# Patient Record
Sex: Male | Born: 1980 | Hispanic: Yes | Marital: Single | Smoking: Never smoker
Health system: Southern US, Community
[De-identification: ages and names within clinical notes are randomized; demographics above are authoritative.]

## PROBLEM LIST (undated history)

## (undated) HISTORY — PX: APPENDECTOMY: SHX54

---

## 2010-07-12 ENCOUNTER — Emergency Department (HOSPITAL_COMMUNITY): Payer: Medicaid Other

## 2010-07-12 ENCOUNTER — Other Ambulatory Visit (INDEPENDENT_AMBULATORY_CARE_PROVIDER_SITE_OTHER): Payer: Self-pay | Admitting: General Surgery

## 2010-07-12 ENCOUNTER — Observation Stay (HOSPITAL_COMMUNITY)
Admission: EM | Admit: 2010-07-12 | Discharge: 2010-07-13 | Disposition: A | Discharge status: Home / Self Care | Payer: Medicaid Other | Attending: General Surgery | Admitting: General Surgery

## 2010-07-12 DIAGNOSIS — K3580 Unspecified acute appendicitis: Principal | ICD-10-CM | POA: Insufficient documentation

## 2010-07-12 LAB — COMPREHENSIVE METABOLIC PANEL
ALT: 43 U/L (ref 0–53)
AST: 27 U/L (ref 0–37)
Albumin: 4.3 g/dL (ref 3.5–5.2)
CO2: 29 mEq/L (ref 19–32)
Calcium: 9.2 mg/dL (ref 8.4–10.5)
Chloride: 100 mEq/L (ref 96–112)
Creatinine, Ser: 0.59 mg/dL (ref 0.4–1.5)
GFR calc Af Amer: >60 mL/min (ref >60)
GFR calc non Af Amer: >60 mL/min (ref >60)
Sodium: 138 mEq/L (ref 135–145)
Total Bilirubin: 0.5 mg/dL (ref 0.3–1.2)

## 2010-07-12 LAB — CBC
HCT: 45.8 % (ref 39.0–52.0)
Hemoglobin: 16.3 g/dL (ref 13.0–17.0)
MCH: 31.8 pg (ref 26.0–34.0)
MCV: 89.3 fL (ref 78.0–100.0)
RBC: 5.13 MIL/uL (ref 4.22–5.81)
WBC: 11.5 10*3/uL — ABNORMAL HIGH (ref 4.0–10.5)

## 2010-07-12 LAB — URINALYSIS, ROUTINE W REFLEX MICROSCOPIC
Bilirubin Urine: NEGATIVE
Glucose, UA: NEGATIVE mg/dL
Ketones, ur: NEGATIVE mg/dL
Specific Gravity, Urine: 1.02 (ref 1.005–1.030)
pH: 6 (ref 5.0–8.0)

## 2010-07-12 LAB — URINE MICROSCOPIC-ADD ON

## 2010-07-12 MED ORDER — IOHEXOL 300 MG/ML  SOLN
100.0000 mL | Freq: Once | INTRAMUSCULAR | Status: AC | PRN
  Administered 2010-07-12: 100 mL via INTRAVENOUS

## 2010-07-31 ENCOUNTER — Emergency Department (HOSPITAL_COMMUNITY): Payer: Self-pay

## 2010-07-31 ENCOUNTER — Emergency Department (HOSPITAL_COMMUNITY)
Admission: EM | Admit: 2010-07-31 | Discharge: 2010-07-31 | Disposition: A | Discharge status: Home / Self Care | Payer: Self-pay | Attending: Emergency Medicine | Admitting: Emergency Medicine

## 2010-07-31 DIAGNOSIS — M546 Pain in thoracic spine: Secondary | ICD-10-CM | POA: Insufficient documentation

## 2010-07-31 DIAGNOSIS — IMO0002 Reserved for concepts with insufficient information to code with codable children: Secondary | ICD-10-CM | POA: Insufficient documentation

## 2010-07-31 DIAGNOSIS — S239XXA Sprain of unspecified parts of thorax, initial encounter: Secondary | ICD-10-CM | POA: Insufficient documentation

## 2010-07-31 LAB — URINALYSIS, ROUTINE W REFLEX MICROSCOPIC
Bilirubin Urine: NEGATIVE
Glucose, UA: NEGATIVE mg/dL
Specific Gravity, Urine: 1.021 (ref 1.005–1.030)
Urobilinogen, UA: 0.2 mg/dL (ref 0.0–1.0)

## 2010-07-31 LAB — URINE MICROSCOPIC-ADD ON

## 2010-08-02 NOTE — H&P (Signed)
  NAMEEDWING, FIGLEY   ACCOUNT NO.:  [id number]  MEDICAL RECORD NO.:  [id number]           PATIENT TYPE:  O  LOCATION:  5120                         FACILITY:  [facility]  PHYSICIAN:  Lennie Muckle, MD      DATE OF BIRTH:  17-Mar-1980  DATE OF ADMISSION:  07/12/2010 DATE OF DISCHARGE:                             HISTORY & PHYSICAL   CHIEF COMPLAINT:  Abdominal pain.  HISTORY OF PRESENT ILLNESS:  This is a 30 year old male who states he began having abdominal pain yesterday.  It is located in the right flank vicinity.  Pain started last night.  He had 3 beers.  He did have some mild nausea but no vomiting.  He denies any fevers, no diarrhea.  The pain persisted.  Describes as a achy pain persisted in the right side. He does not have any increasing pain with eating.  He has had no previous history of surgeries.  He was seen in the emergency department and noted to have an elevated white count of 11.5.  A CT was obtained which did reveal acute inflammation of the appendix with stranding and a large appendix consistent with acute appendicitis.  PAST MEDICAL HISTORY:  Negative.  PAST SURGICAL HISTORY:  Negative.  FAMILY HISTORY:  Negative.  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  No tobacco or alcohol use.  REVIEW OF SYSTEMS:  Negative other than the HPI.  MEDICATIONS:  None.  ALLERGIES:  None.  PHYSICAL EXAMINATION:  GENERAL:  Obese male in no acute distress, lying in stretcher. VITAL SIGNS:  Temperature is 98.7, heart rate 49, and blood pressure 135/76. HEENT:  Head is normocephalic, atraumatic.  Extraocular muscles are intact.  Pupils are equal and round.  Sclerae and conjunctivae are clear.  Nares are clear without drainage.  Oral mucosa is pink and moist.  Dentition is good. NECK:  No tenderness.  Normal range of motion.  Thyroid without abnormalities.  Trachea midline. RESPIRATORY:  Clear to auscultation  bilaterally.  Normal expansion and excursion. CARDIOVASCULAR:  Regular rate and rhythm.  No murmurs, gallops, or rubs. EXTREMITIES:  Pulses are palpable in upper extremity.  No edema in lower extremity. ABDOMEN:  Obese, soft, mildly tender on the right side of the abdomen. No frank peritonitis.  No hernias.  No masses.  Exam is a slight limited due to body habitus. MUSCULOSKELETAL:  No deformities, no pain with palpation of joints. NEUROLOGIC:  Normal. PSYCHOLOGIC:  Normal.  IMAGING:  CT is reviewed.  There is enlarged inflamed appendix.  No evidence of perforation.  Other labs are seemingly within normal limits with a mildly elevated white count of 11.5.  ASSESSMENT AND PLAN:  Acute appendicitis.  PLAN:  Laparoscopic appendectomy with hopefully discharge home after the surgery.  We will give Invanz preoperatively impending on the results of the CIB surgery and may send him home on antibiotics.     Lennie Muckle, MD     ALA/MEDQ  D:  07/12/2010  T:  07/12/2010  Job:  [id number]  Electronically Signed by Bertram Savin MD on 08/02/2010 12:15:49 PM

## 2010-08-02 NOTE — Op Note (Signed)
Gary Osborne, Gary Osborne   ACCOUNT NO.:  [id number]  MEDICAL RECORD NO.:  [id number]           PATIENT TYPE:  O  LOCATION:  5120                         FACILITY:  [facility]  PHYSICIAN:  Lennie Muckle, MD      DATE OF BIRTH:  Jul 14, 1980  DATE OF PROCEDURE: DATE OF DISCHARGE:                              OPERATIVE REPORT   PREOPERATIVE DIAGNOSIS:  Acute appendicitis.  POSTOPERATIVE DIAGNOSES: 1. Acute appendicitis. 2. Inflamed epiploica.  PROCEDURE:  Laparoscopic appendectomy with removal of epiploica.  SURGEON:  Lennie Muckle, MD  ASSISTANT:  OR staff.  FINDINGS: 1. Mildly inflamed appendix. 2. Inflamed epiploica with slight necrosis.  SPECIMEN: 1. Epiploica. 2. Appendix.  ESTIMATED BLOOD LOSS:  Minimal amount of blood loss.  ANESTHESIA:  General endotracheal.  INDICATIONS FOR PROCEDURE:  Gary Osborne is a 30 year old male with right flank pain for 24 hours, had a CT scan, which showed inflamed appendix and an inflammation in the right lower quadrant.  He was seen, evaluated, and agreed with diagnosis of acute appendicitis. Discussed laparoscopic appendectomy and received IV Invanz.  DETAILS OF PROCEDURE:  The patient was taken from the emergency department to preoperative holding, seen by Anesthesia and taken to the operating suite, and was given IV antibiotics prior to operating room. Once he was in the operating room, placed in supine position. Sequential compression devices were applied to his lower extremity.  He was placed under general endotracheal anesthesia and then a Foley catheter was placed.  His abdomen was prepped and draped in usual sterile fashion.  Surgical time-out was performed.  I began by placing an incision in the right upper quadrant after surgical time-out was performed.  I used a 5-mm camera in the OptiVu and placed the camera into the abdominal cavity.  All layers of abdominal wall were visualized upon entry.  I inspected  the abdomen and found no evidence of injury upon placement of trocar.  I then placed a 5-cm trocar at the umbilical region.  I then upsized the right upper quadrant with an 11-mm trocar and placed a 5-mm trocar in the left lower quadrant.  I began by mobilizing the cecum medially.  I dissected along the line of Toldt with the Harmonic scalpel.  Once I fully mobilized the cecum midportion of ascending colon, I continued point this medially.  I found a piece of necrotic tissue which I first thought that was the appendix.  This just pulled from away from around the vicinity of the cecum.  I placed an EndoCatch bag, had fatty tissue around it.  I further inspected around the cecum and found a specimen which appeared likely appendix.  I continued freeing this up and sure enough it was a mildly inflamed appendix coursing inferiorly.  I freed up the remaining lateral attachments with the Harmonic scalpel, continued dissecting the mesoappendix with a Harmonic scalpel.  Once I lengthened the appendix, I was able to place lateral laparoscopic load across the base of the appendix with a GI stapler.  I then placed the appendix in an EndoCatch bag and irrigated the abdomen.  I inspected the staple line and found no evidence of bleeding.  There was no evidence  of purulent fluid.  I used approximately a liter and a half of fluid to irrigate the abdomen.  Once I was satisfied there was no bleeding from the staple line of the mesoappendix, I removed the two specimens from the right upper quadrant. Once the specimens were removed, I closed the fascial defect using a 0 Vicryl suture on the suture passer.  Once satisfied with my closure, I injected 30 mL of Marcaine at all sites for local block.  The patient tolerated the procedure well.  I closed the skin with a 4-0 Monocryl and the Dermabond was placed for final dressing.  The patient was extubated and transferred to the postanesthesia care in stable  condition, to be discharged home either today or tomorrow.     Lennie Muckle, MD     ALA/MEDQ  D:  07/12/2010  T:  07/13/2010  Job:  [id number]  Electronically Signed by Bertram Savin MD on 08/02/2010 12:15:53 PM

## 2010-08-11 ENCOUNTER — Ambulatory Visit (INDEPENDENT_AMBULATORY_CARE_PROVIDER_SITE_OTHER): Payer: Self-pay | Admitting: Surgery

## 2010-08-11 ENCOUNTER — Encounter (INDEPENDENT_AMBULATORY_CARE_PROVIDER_SITE_OTHER): Payer: Self-pay | Admitting: Surgery

## 2010-08-11 DIAGNOSIS — K3580 Unspecified acute appendicitis: Secondary | ICD-10-CM | POA: Insufficient documentation

## 2010-08-11 MED ORDER — NAPROXEN 500 MG PO TABS: 500.0000 mg | ORAL_TABLET | Freq: Two times a day (BID) | ORAL | Status: AC

## 2010-08-11 NOTE — Progress Notes (Signed)
Subjective:     Patient ID: Gary Osborne, male   DOB: 1980/12/23, 30 y.o.   MRN: [id number]    There were no vitals taken for this visit.    HPI  Diagnosis acute appendicitis  Reason visits followup.  Patient comes a feeling sore in his abdomen. Its mainly in his right upper quadrant @ the larger port site where the appendix was extracted. He went to the emergency room with some right back pain. He was started on some anti-inflammatories. The naproxen helped. He wishes a refill on some pain meds.  Patient denies fevers chills or sweats. Patient denies nausea vomiting. He's eating well. He is having regular bowel movements. No drainage from his incisions. He does feel lumps of the incisions and that was a concern to him.  Review of Systems  Constitutional: Negative for fever, chills and diaphoresis.  Gastrointestinal: Positive for abdominal pain. Negative for vomiting, diarrhea, constipation, abdominal distention, anal bleeding and rectal pain.  Genitourinary: Negative for dysuria, scrotal swelling and testicular pain.  Musculoskeletal: Positive for myalgias. Negative for back pain, joint swelling, arthralgias and gait problem.  All other systems reviewed and are negative.       Objective:   Physical Exam  Constitutional: He is oriented to person, place, and time. He appears well-developed and well-nourished. No distress.  HENT:  Head: Normocephalic.  Mouth/Throat: Oropharynx is clear and moist. No oropharyngeal exudate.  Cardiovascular: Normal rate and intact distal pulses.   Pulmonary/Chest: Effort normal. No respiratory distress.  Abdominal: Soft. Bowel sounds are normal. He exhibits no distension. There is no rebound and no guarding.       Mild TTP at RUQ port site only.  No cellulitis, no hernia.  Normal healing ridges at incisions.  Musculoskeletal: Normal range of motion. He exhibits no edema and no tenderness.  Neurological: He is alert and oriented to person,  place, and time. No cranial nerve deficit.  Skin: Skin is warm and dry. No rash noted. He is not diaphoretic. No erythema. No pallor.  Psychiatric: He has a normal mood and affect. His behavior is normal.       Assessment:     Acute appendicitis with no evidence of complications  Right flank and upper quadrant of soreness consistent with musculoskeletal strain. Improving.    Plan:     Return to clinic p.r.n.  The naproxen and heat/ice for muscular strain. I suspect is related to increased activity. He started at work. I see no hernia, cellulitis, or other concerns. He felt reassured. I noted normal healing ridges will soften and flatten down in the next one to 2 months.  Discussion was made with R. Spanish interpreter. He felt with this plan.

## 2011-09-04 IMAGING — CT CT ABD-PELV W/ CM
2 of 4 series · 14 of 32 positions shown, 19 images · IV contrast (agent unspecified)
Comparison: None.

CLINICAL DATA: Right lower quadrant abdominal pain and right flank
pain with nausea.

CT ABDOMEN AND PELVIS WITH CONTRAST
TECHNIQUE: Multidetector CT imaging of the abdomen and pelvis was
performed following the standard protocol during bolus
administration of intravenous contrast.
Contrast: 100 ml Qmnipaque-WZZ IV

[Series 2: routine abdomen · axial · 0.85mm/px · z∈[-421,-16]mm · 8 of 105 slices shown, 13 images]
[im 12/105  soft-tissue]
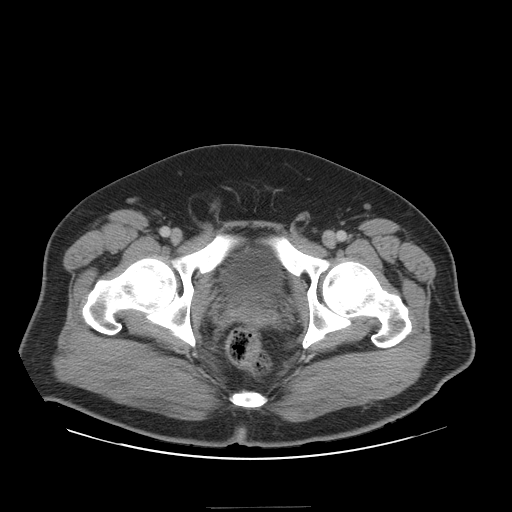
[im 12/105  bone]
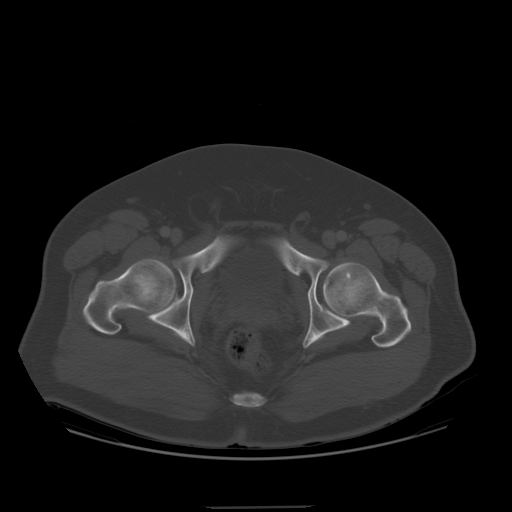
[im 24/105  soft-tissue]
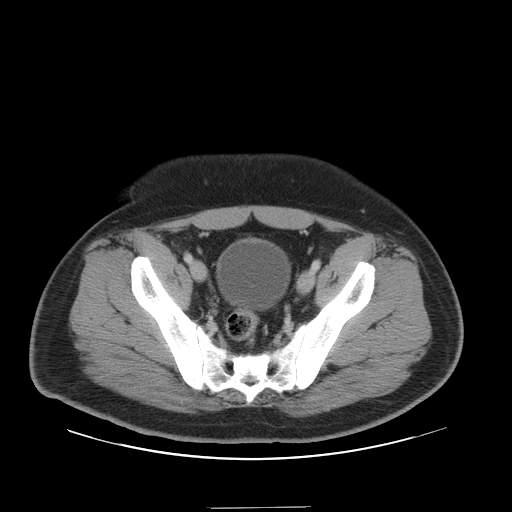
[im 35/105  soft-tissue]
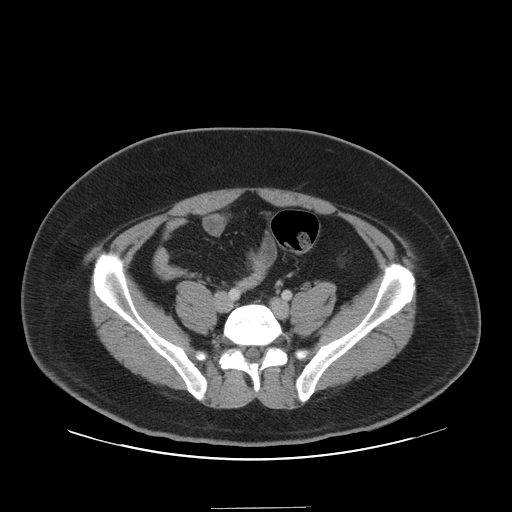
[im 47/105  soft-tissue]
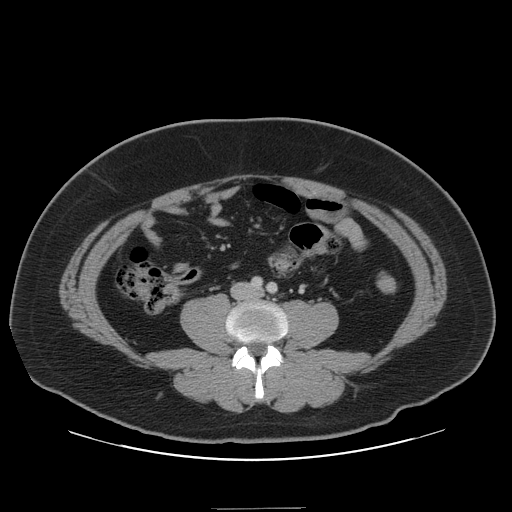
[im 58/105  soft-tissue]
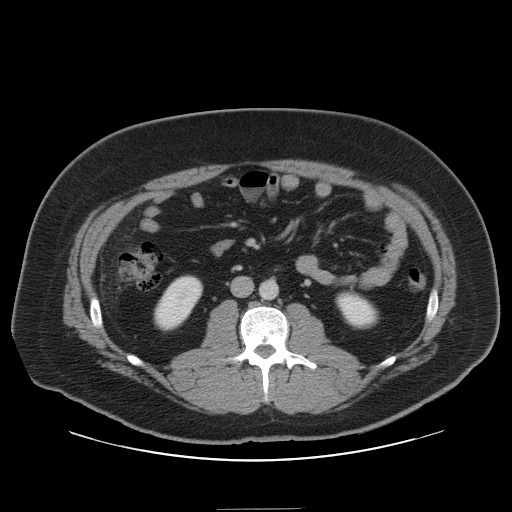
[im 58/105  lung]
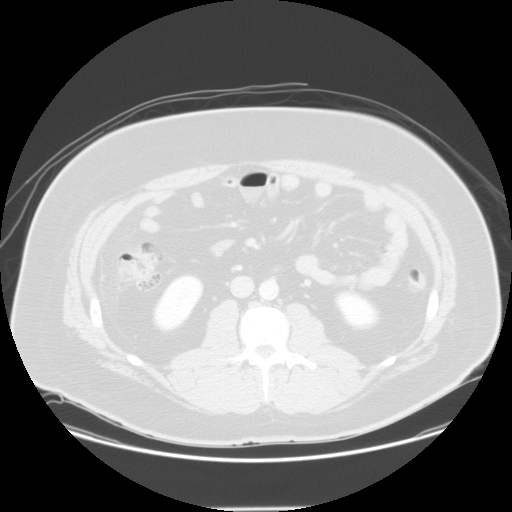
[im 70/105  soft-tissue]
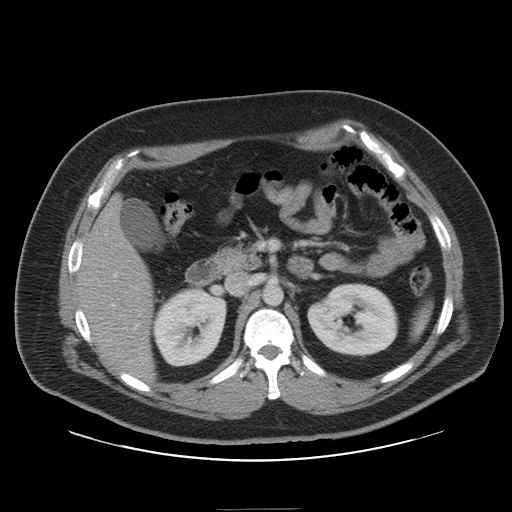
[im 70/105  lung]
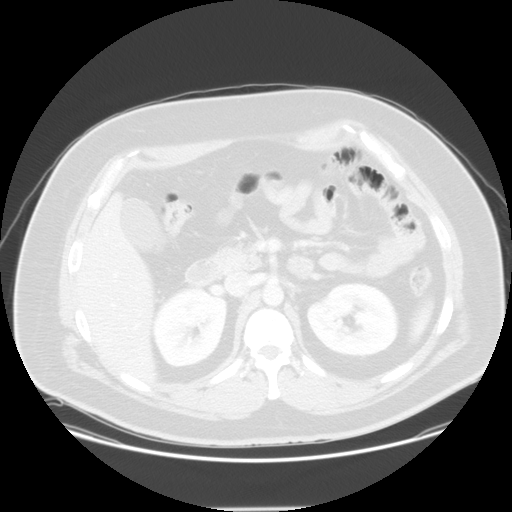
[im 81/105  soft-tissue]
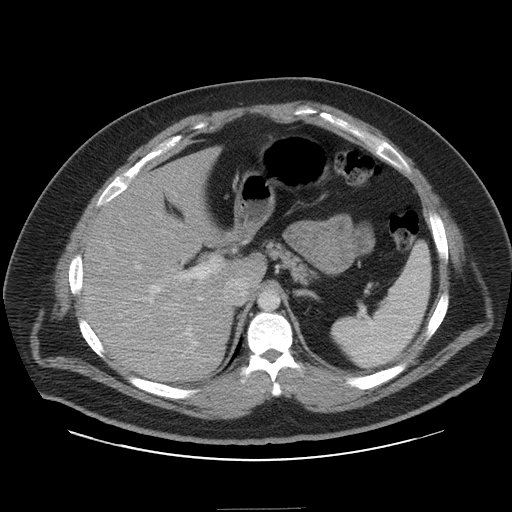
[im 81/105  lung]
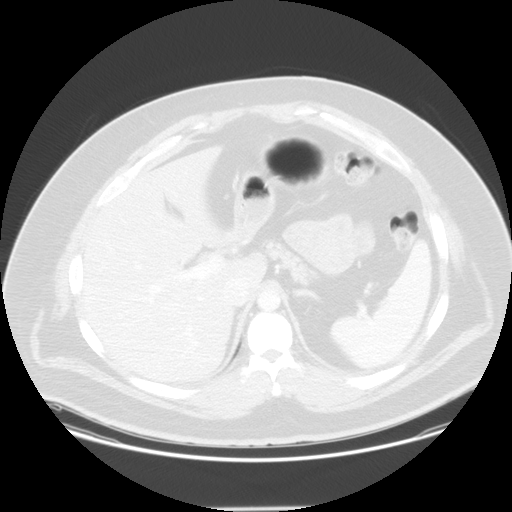
[im 93/105  soft-tissue]
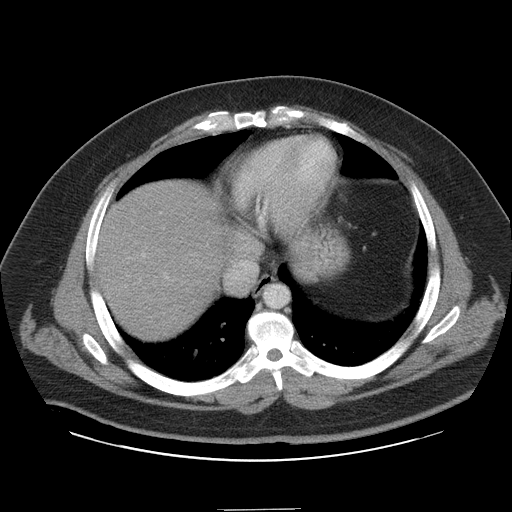
[im 93/105  lung]
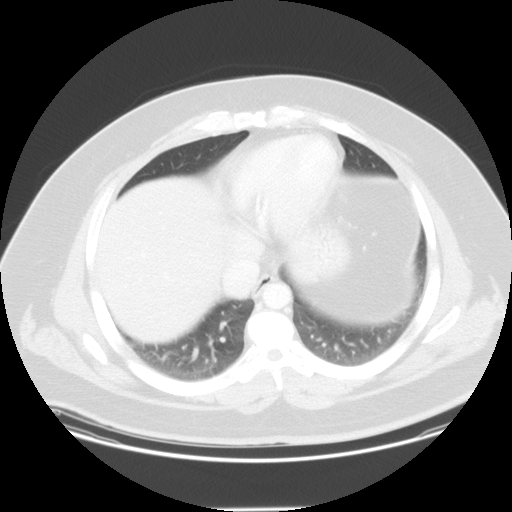

[Series 401: sagittal · sagittal · 1.04mm/px · 6 of 119 slices shown]
[im 12/119  soft-tissue]
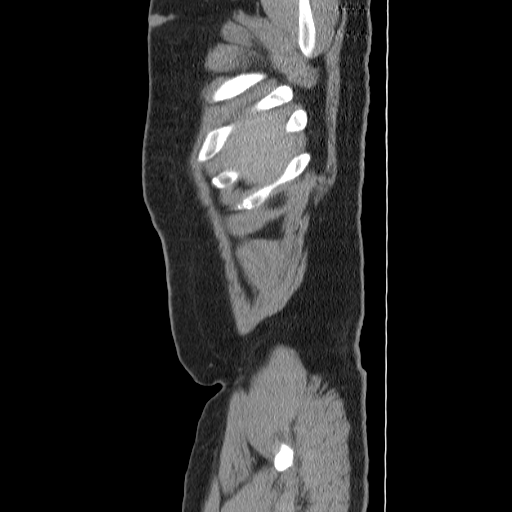
[im 24/119  soft-tissue]
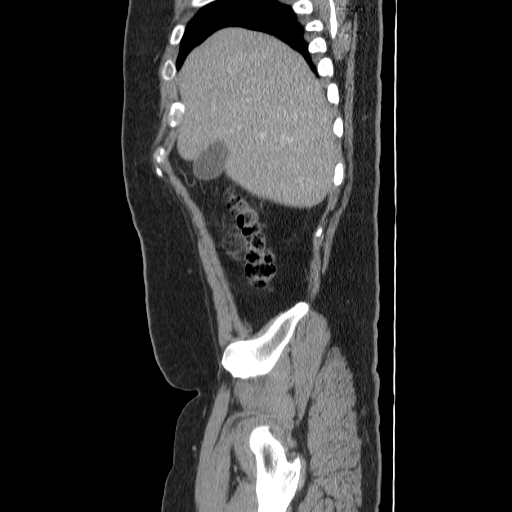
[im 36/119  soft-tissue]
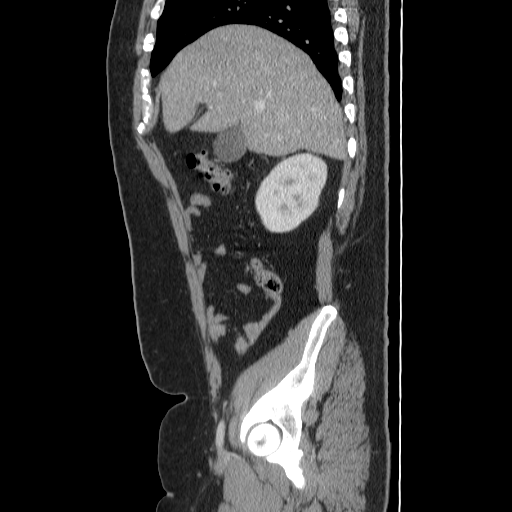
[im 48/119  soft-tissue]
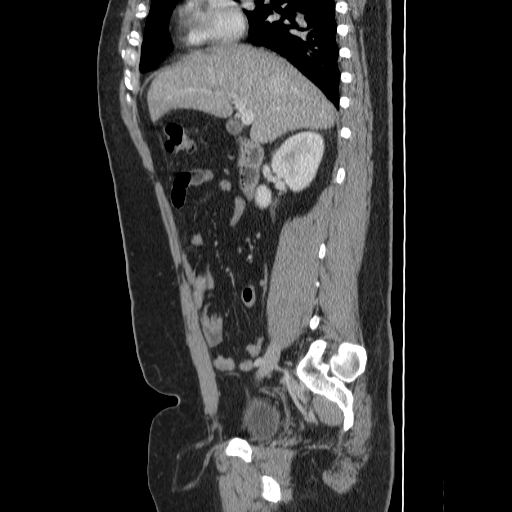
[im 71/119  soft-tissue]
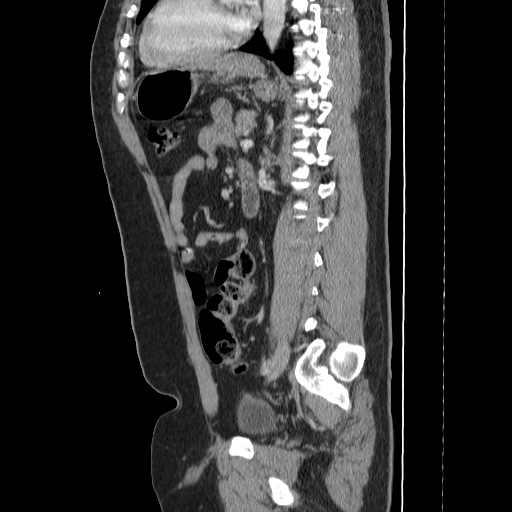
[im 83/119  soft-tissue]
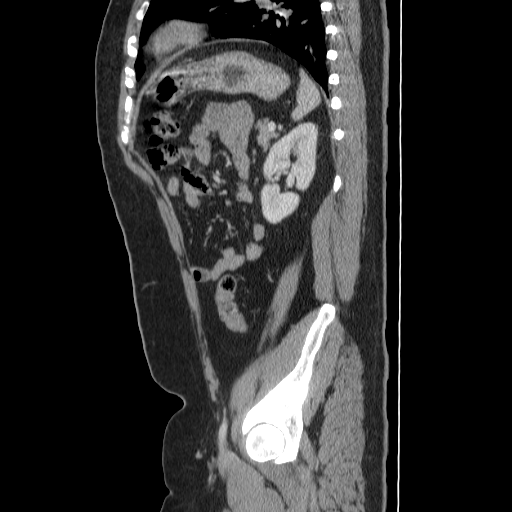

[14 of 32 positions shown; findings below may reference images not displayed]

FINDINGS: There is evidence of acute appendicitis with inflamed
appendix present which lies just lateral to the cecum.  Margins of
the appendix are somewhat obscured by surrounding inflammatory
changes.  The appendix is dilated to at least 10 mm.  Significant
surrounding inflammatory changes may be consistent with
perforation.  However, there is no evidence of abscess or free air.

No other acute findings.  The liver shows mild fatty infiltration.
The gallbladder, pancreas, adrenal glands, kidneys and spleen are
unremarkable.  There is a small right inguinal hernia containing
fat.  The bladder is unremarkable.  No incidental masses or
enlarged lymph nodes.  Bony structures are unremarkable.
IMPRESSION: Acute appendicitis with inflamed appendix lying lateral to the
cecum.  Significant inflammatory changes surround the appendix.  No
evidence of focal abscess or free air.

## 2011-09-23 IMAGING — CR DG CHEST 2V
2 series · 2 of 2 positions shown · non-contrast
Comparison: None.

CLINICAL DATA: Right-sided pleuritic chest pain.

CHEST - 2 VIEW

[w chest pa]
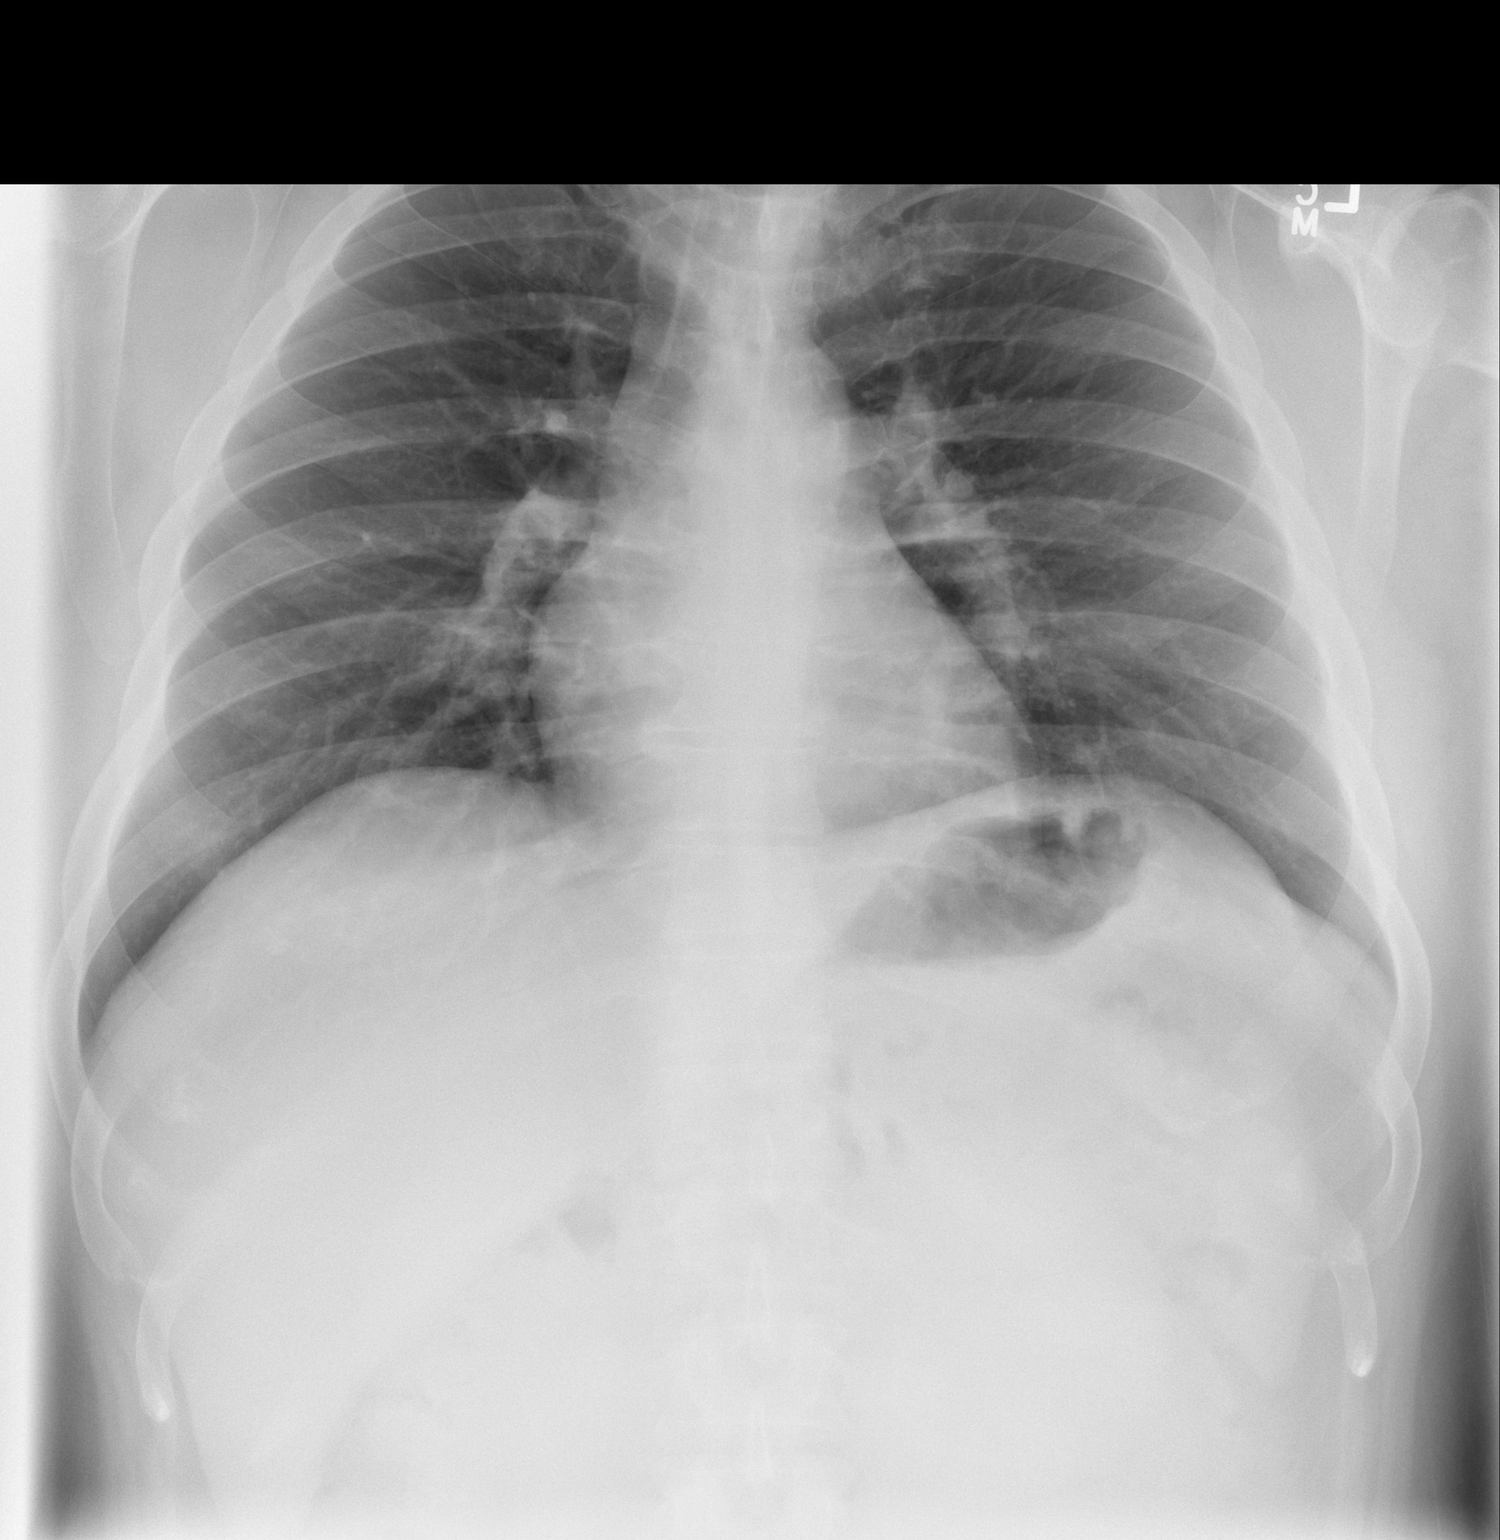

[w chest lat]
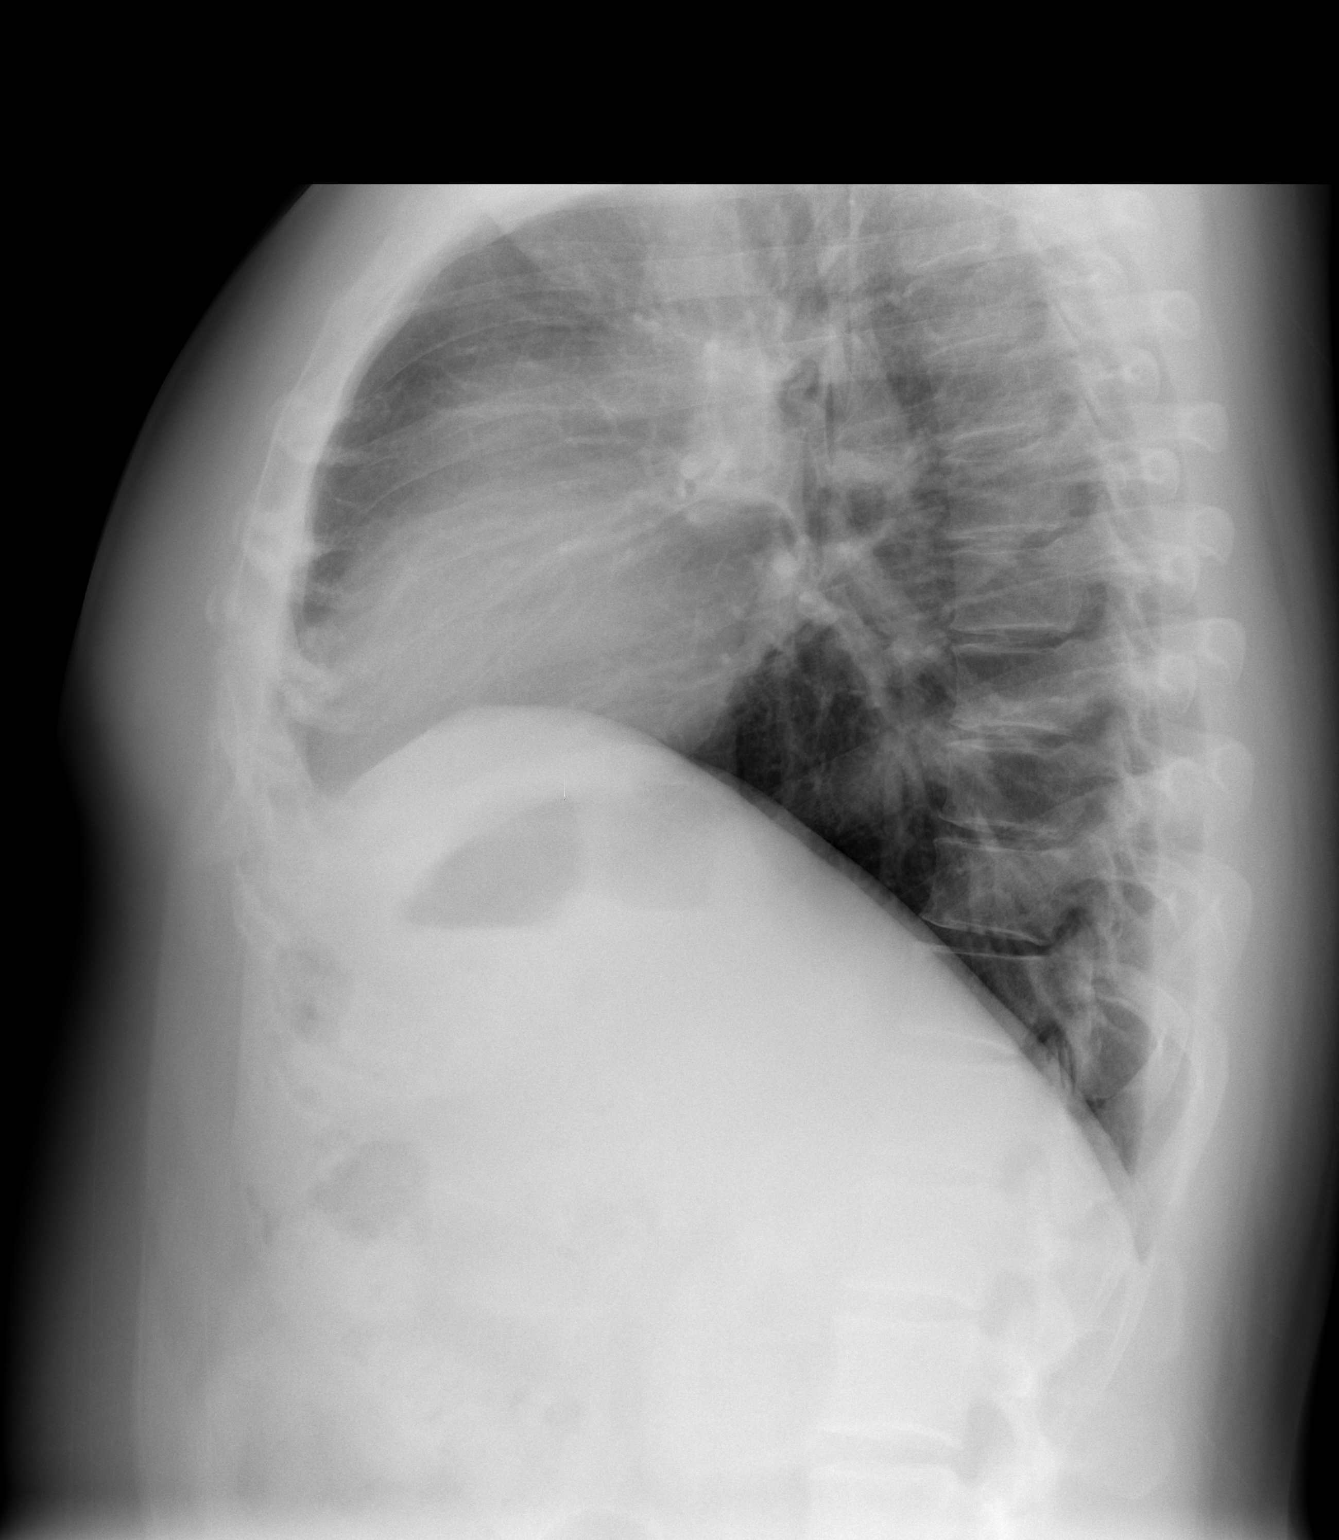

[2 of 2 positions shown; findings below may reference images not displayed]

FINDINGS: The heart size and mediastinal contours are within
normal limits.  Both lungs are clear.  The visualized skeletal
structures are unremarkable.
IMPRESSION: No active cardiopulmonary disease.

## 2015-08-12 ENCOUNTER — Emergency Department (HOSPITAL_COMMUNITY)
Admission: EM | Admit: 2015-08-12 | Discharge: 2015-08-12 | Disposition: A | Discharge status: Home / Self Care | Payer: Medicaid Other | Attending: Emergency Medicine | Admitting: Emergency Medicine

## 2015-08-12 DIAGNOSIS — Z791 Long term (current) use of non-steroidal anti-inflammatories (NSAID): Secondary | ICD-10-CM | POA: Insufficient documentation

## 2015-08-12 DIAGNOSIS — K649 Unspecified hemorrhoids: Secondary | ICD-10-CM

## 2015-08-12 NOTE — ED Notes (Signed)
Pt with right ear pain and itching for the last several months. States has been using prescribed drops for several refills and no improvement in symptoms. Pt also c/o a sore near his rectum for the last couple months. Pt reports burning pain at the site when wiping. Pt reports some constipation as well. Pt awake, alert, oriented x4, VSS.

## 2015-08-12 NOTE — ED Provider Notes (Signed)
CSN: [id number]     Arrival date & time 08/12/15  0810 History   First MD Initiated Contact with Patient 08/12/15 0813     Chief Complaint  Patient presents with  . Otalgia  . Rectal Pain   HPI The patient presents to emergency room with complaints of itching in his right ear. Patient states this started several months ago. He has seen his primary doctor and was given prescription for eardrops. The symptoms persist. He denies any drainage. He denies any difficulty with his hearing. It's not painful. It seems to come and go. He often notices a scratching or movement type sensation inside his right ear in the mornings.  Patient's primary doctor told him that if it continued the next step would be to see an ENT doctor.  Patient also noticed a small lump in his rectal area of the other day. He is concerned it could be something like cancer. He denies any trouble with pain, fevers.  He has had some constipation recently. No past medical history on file. Past Surgical History  Procedure Laterality Date  . Appendectomy     No family history on file. Social History  Substance Use Topics  . Smoking status: Never Smoker   . Smokeless tobacco: Never Used  . Alcohol Use: Yes    Review of Systems  All other systems reviewed and are negative.     Allergies  Review of patient's allergies indicates no known allergies.  Home Medications   Prior to Admission medications   Medication Sig Start Date End Date Taking? Authorizing Provider  naproxen (NAPROSYN) 500 MG tablet Take 1 tablet (500 mg total) by mouth 2 (two) times daily with a meal. 08/11/10   Karie SodaSteven Gross, MD   BP 155/85 mmHg  Pulse 106  Temp(Src) 97.9 F (36.6 C) (Oral)  Resp 14  SpO2 97% Physical Exam  Constitutional: He appears well-developed and well-nourished. No distress.  HENT:  Head: Normocephalic and atraumatic.  Right Ear: Tympanic membrane, external ear and ear canal normal. No drainage or swelling. No foreign bodies.  No middle ear effusion.  Left Ear: External ear normal.  Eyes: Conjunctivae are normal. Right eye exhibits no discharge. Left eye exhibits no discharge. No scleral icterus.  Neck: Neck supple. No tracheal deviation present.  Cardiovascular: Normal rate.   Pulmonary/Chest: Effort normal. No stridor. No respiratory distress.  Genitourinary: Rectal exam shows external hemorrhoid.  Small external hemorrhoid that is not erythematous or indurated  Musculoskeletal: He exhibits no edema.  Neurological: He is alert. Cranial nerve deficit: no gross deficits.  Skin: Skin is warm and dry. No rash noted.  Psychiatric: He has a normal mood and affect.  Nursing note and vitals reviewed.   ED Course  Procedures (including critical care time)   MDM   Final diagnoses:  Hemorrhoids, unspecified hemorrhoid type    I reassured the patient that he has a hemorrhoid. This is a benign condition. It could be related to his recent constipation. He can do warm sitz baths and use over-the-counter stool softeners as needed.  There is no foreign body or any other abnormality noted in his ear. I recommended he follow up with his doctor and discuss whether an ENT consultation would be helpful.    Linwood DibblesJon Winona Sison, MD 08/12/15 [phone]

## 2016-02-22 ENCOUNTER — Ambulatory Visit (INDEPENDENT_AMBULATORY_CARE_PROVIDER_SITE_OTHER): Payer: Self-pay | Admitting: Orthopedic Surgery

## 2016-02-22 ENCOUNTER — Encounter (INDEPENDENT_AMBULATORY_CARE_PROVIDER_SITE_OTHER): Payer: Self-pay

## 2016-02-22 DIAGNOSIS — M654 Radial styloid tenosynovitis [de Quervain]: Secondary | ICD-10-CM | POA: Insufficient documentation

## 2016-02-22 MED ORDER — LIDOCAINE HCL 1 % IJ SOLN
1.0000 mL | INTRAMUSCULAR | Status: AC | PRN
  Administered 2016-02-22: 1 mL

## 2016-02-22 MED ORDER — METHYLPREDNISOLONE ACETATE 40 MG/ML IJ SUSP
40.0000 mg | INTRAMUSCULAR | Status: AC | PRN
  Administered 2016-02-22: 40 mg

## 2016-02-22 NOTE — Progress Notes (Signed)
   Office Visit Note   Patient: Gary GavelJaime Gutierrez-Mondragon           Date of Birth: [birth year]           MRN: [id number] Visit Date: 02/22/2016              Requested by: No referring provider defined for this encounter. PCP: No PCP Per Patient  Chief Complaint  Patient presents with  . Left Wrist - Pain    HPI: Patient is a 36 year old gentleman who reports 3 months of constant left wrist pain. At the base of the thumb. Has tried splinting as well as naproxen and other nonsteroidals. States these provided relief while taking them as soon as he stops the course has return of pain and symptoms. 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  Right-hand-dominant    Assessment & Plan: Visit Diagnoses:  1. De Quervain's tenosynovitis, left     Plan: the first dorsal extensor compartment was injected he tolerated this well he will uses splint as needed and follow up as needed. Discussed that he may require an additional injection.  Follow-Up Instructions: Return if symptoms worsen or fail to improve.   Ortho Exam Examination patient is alert oriented no adenopathy well-dressed normal affect normal respiratory effort he has a normal gait. Examination patient is pain to palpation of the first dorsal extensor compartment. The Finkelstein's test is positive. The remainder of his exam is normal he has good strength and good sensation.  Imaging: No results found.  Orders:  No orders of the defined types were placed in this encounter.  No orders of the defined types were placed in this encounter.    Procedures: Hand/UE Inj Date/Time: 02/22/2016 4:01 PM Performed by: Tammera Engert V Authorized by: Nadara MustardUDA, Kelbie Moro V   Consent Given by:  Patient Site marked: the procedure site was marked   Timeout: prior to procedure the correct patient, procedure, and site was verified   Indications:  Diagnostic and therapeutic Condition: de Quervain's     Site:  L extensor compartment 1 Prep: patient was prepped and draped in usual sterile fashion   Needle Size:  22 G Approach:  Dorsal Ultrasound Guidance: No   Medications:  1 mL lidocaine 1 %; 40 mg methylPREDNISolone acetate 40 MG/ML Patient tolerance:  Patient tolerated the procedure well with no immediate complications    Clinical Data: No additional findings.  Subjective: Review of Systems  Objective: Vital Signs: There were no vitals taken for this visit.  Specialty Comments:  No specialty comments available.  PMFS History: Patient Active Problem List   Diagnosis Date Noted  . De Quervain's tenosynovitis, left 02/22/2016  . Acute appendicitis, uncomplicated 08/11/2010   No past medical history on file.  No family history on file.  Past Surgical History:  Procedure Laterality Date  . APPENDECTOMY     Social History   Occupational History  . Not on file.   Social History Main Topics  . Smoking status: Never Smoker  . Smokeless tobacco: Never Used  . Alcohol use Yes  . Drug use: Unknown  . Sexual activity: Not on file

## 2016-03-16 ENCOUNTER — Telehealth (INDEPENDENT_AMBULATORY_CARE_PROVIDER_SITE_OTHER): Payer: Self-pay | Admitting: *Deleted

## 2016-03-16 NOTE — Telephone Encounter (Signed)
Patient came in this morning in regards to having some continued pain in his left arm. He had an injection on January the 8th and he is still having some pain and hurts to grab things. His CB # [phone]. Thank you

## 2016-03-16 NOTE — Telephone Encounter (Signed)
Per last note Dr. Lajoyce Cornersuda said to come back if not better. Pt needs appt. Please call

## 2016-03-21 ENCOUNTER — Ambulatory Visit (INDEPENDENT_AMBULATORY_CARE_PROVIDER_SITE_OTHER): Payer: Self-pay | Admitting: Orthopedic Surgery

## 2016-03-21 DIAGNOSIS — M654 Radial styloid tenosynovitis [de Quervain]: Secondary | ICD-10-CM

## 2016-03-21 MED ORDER — LIDOCAINE HCL 1 % IJ SOLN
1.0000 mL | INTRAMUSCULAR | Status: AC | PRN
  Administered 2016-03-21: 1 mL

## 2016-03-21 MED ORDER — METHYLPREDNISOLONE ACETATE 40 MG/ML IJ SUSP
40.0000 mg | INTRAMUSCULAR | Status: AC | PRN
  Administered 2016-03-21: 40 mg

## 2016-03-21 NOTE — Progress Notes (Signed)
   Office Visit Note   Patient: Gary GavelJaime Osborne           Date of Birth: 1980-08-10           MRN: [id number] Visit Date: 03/21/2016              Requested by: No referring provider defined for this encounter. PCP: No PCP Per Patient  Chief Complaint  Patient presents with  . Left Hand - Follow-up    De Quervain's tenosynovitis, left       HPI: Patient here today for follow up of left hand De Quervain's tenosynovitis s/p injection last month. The pt states that while this did help greatly that he is still having decreased grip/ strength. He also say when direct pressure is applied to the wrist he has pain and feels a "clicking" in the wrist Rodena MedinAutumn L Forrest, RMA     Assessment & Plan: Visit Diagnoses:  1. De Quervain's tenosynovitis, left     Plan: Increase activities as tolerated follow-up as needed possible repeat injection follow-up.  Follow-Up Instructions: Return if symptoms worsen or fail to improve.   Ortho Exam On examination patient is alert oriented no adenopathy well-dressed normal affect. Examination he has good pulses he has point tender to palpation over the first dorsal extensor compartment left wrist there is some mild crepitation with range of motion. Finkelstein's test is painful. After informed consent the first dorsal extensor compartment was injected without complications.  Imaging: No results found.  Orders:  No orders of the defined types were placed in this encounter.  No orders of the defined types were placed in this encounter.    Procedures: Hand/UE Inj Date/Time: 03/21/2016 10:50 AM Performed by: Hason Ofarrell V Authorized by: Nadara MustardUDA, Hanley Woerner V   Consent Given by:  Patient Site marked: the procedure site was marked   Timeout: prior to procedure the correct patient, procedure, and site was verified   Indications:  Diagnostic and therapeutic Condition: de Quervain's   Site:  L extensor compartment 1 Prep: patient was prepped and  draped in usual sterile fashion   Needle Size:  22 G Approach:  Volar Ultrasound Guidance: No   Medications:  1 mL lidocaine 1 %; 40 mg methylPREDNISolone acetate 40 MG/ML Patient tolerance:  Patient tolerated the procedure well with no immediate complications    Clinical Data: No additional findings.  Subjective: Review of Systems  Objective: Vital Signs: There were no vitals taken for this visit.  Specialty Comments:  No specialty comments available.  PMFS History: Patient Active Problem List   Diagnosis Date Noted  . De Quervain's tenosynovitis, left 02/22/2016  . Acute appendicitis, uncomplicated 08/11/2010   No past medical history on file.  No family history on file.  Past Surgical History:  Procedure Laterality Date  . APPENDECTOMY     Social History   Occupational History  . Not on file.   Social History Main Topics  . Smoking status: Never Smoker  . Smokeless tobacco: Never Used  . Alcohol use Yes  . Drug use: Unknown  . Sexual activity: Not on file

## 2017-02-03 ENCOUNTER — Encounter (INDEPENDENT_AMBULATORY_CARE_PROVIDER_SITE_OTHER): Payer: Self-pay | Admitting: Family

## 2017-02-03 ENCOUNTER — Ambulatory Visit (INDEPENDENT_AMBULATORY_CARE_PROVIDER_SITE_OTHER): Payer: Self-pay | Admitting: Family

## 2017-02-03 DIAGNOSIS — G5601 Carpal tunnel syndrome, right upper limb: Secondary | ICD-10-CM

## 2017-02-03 MED ORDER — PREDNISONE 50 MG PO TABS: ORAL_TABLET | ORAL | 0 refills | Status: DC

## 2017-02-13 ENCOUNTER — Encounter (INDEPENDENT_AMBULATORY_CARE_PROVIDER_SITE_OTHER): Payer: Self-pay | Admitting: Family

## 2017-02-13 ENCOUNTER — Encounter (INDEPENDENT_AMBULATORY_CARE_PROVIDER_SITE_OTHER): Payer: Self-pay | Admitting: Orthopaedic Surgery

## 2017-02-13 ENCOUNTER — Ambulatory Visit (INDEPENDENT_AMBULATORY_CARE_PROVIDER_SITE_OTHER): Payer: Self-pay | Admitting: Orthopaedic Surgery

## 2017-02-13 DIAGNOSIS — G5601 Carpal tunnel syndrome, right upper limb: Secondary | ICD-10-CM

## 2017-02-13 NOTE — Progress Notes (Signed)
   Office Visit Note   Patient: Gary GavelJaime Gutierrez-Mondragon           Date of Birth: Mar 01, 1980           MRN: [id number] Visit Date: 02/13/2017              Requested by: No referring provider defined for this encounter. PCP: Patient, No Pcp Per   Assessment & Plan: Visit Diagnoses:  1. Carpal tunnel syndrome, right upper limb     Plan: Impression is right carpal tunnel syndrome.  EMG is ordered today to assess severity.  Follow-up after the EMGs.  Follow-Up Instructions: Return if symptoms worsen or fail to improve.   Orders:  No orders of the defined types were placed in this encounter.  No orders of the defined types were placed in this encounter.     Procedures: No procedures performed   Clinical Data: No additional findings.   Subjective: Chief Complaint  Patient presents with  . Right Wrist - Pain    Patient follows up today for his suspected right carpal tunnel syndrome.  He is partially better from a prednisone taper.  He still continues to have some numbness in his hand and fingers.    Review of Systems   Objective: Vital Signs: There were no vitals taken for this visit.  Physical Exam  Ortho Exam Right hand exam is stable.  No significant muscular atrophy. Specialty Comments:  No specialty comments available.  Imaging: No results found.   PMFS History: Patient Active Problem List   Diagnosis Date Noted  . De Quervain's tenosynovitis, left 02/22/2016  . Acute appendicitis, uncomplicated 08/11/2010   History reviewed. No pertinent past medical history.  History reviewed. No pertinent family history.  Past Surgical History:  Procedure Laterality Date  . APPENDECTOMY     Social History   Occupational History  . Not on file  Tobacco Use  . Smoking status: Never Smoker  . Smokeless tobacco: Never Used  Substance and Sexual Activity  . Alcohol use: Yes  . Drug use: Not on file  . Sexual activity: Not on file

## 2017-02-13 NOTE — Progress Notes (Signed)
   Office Visit Note   Patient: Gary Osborne           Date of Birth: [birth year]           MRN: [id number] Visit Date: 02/03/2017              Requested by: No referring provider defined for this encounter. PCP: Patient, No Pcp Per  Chief Complaint  Patient presents with  . Right Wrist - Pain      HPI: The patient is a 36 year old gentleman seen seen for evaluation of right wrist pain for last month or more. Is worse at night. 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 Has been having increased pain with work as well. Is right hand dominant. Swelling to hand and wrist. Taking ibu and naproxen without relief.   Has hx of de quervain on left.   Assessment & Plan: Visit Diagnoses:  1. Carpal tunnel syndrome, right upper limb     Plan: Will trial pred course. Continue wrist splint every pm and whenever able during day. Will follow with Xu to discuss carpal tunnel release.   Follow-Up Instructions: Return in about 2 weeks (around 02/17/2017), or if symptoms worsen or fail to improve.   Right Hand Exam   Tenderness  The patient is experiencing tenderness in the radial area.  Range of Motion  The patient has normal right wrist ROM.   Muscle Strength  The patient has normal right wrist strength. Grip: 5/5   Tests  Phalen's sign: positive Tinel's sign (median nerve): positive Finkelstein's test: negative  Other  Erythema: absent Pulse: present      Patient is alert, oriented, no adenopathy, well-dressed, normal affect, normal respiratory effort.   Imaging: No results found. No images are attached to the encounter.  Labs: No results found for: HGBA1C, ESRSEDRATE, CRP, LABURIC, REPTSTATUS, GRAMSTAIN, CULT, LABORGA  @LABSALLVALUES (HGBA1)@  There is no height or weight on file to calculate BMI.  Orders:  No orders of the defined types were placed in this encounter.  Meds ordered this encounter  Medications  . DISCONTD: predniSONE (DELTASONE) 50 MG tablet    Sig:  Take one tablet by mouth for 5 days.    Dispense:  5 tablet    Refill:  0  . predniSONE (DELTASONE) 50 MG tablet    Sig: Take one tablet by mouth for 5 days.    Dispense:  5 tablet    Refill:  0     Procedures: No procedures performed  Clinical Data: No additional findings.  ROS:  All other systems negative, except as noted in the HPI. Review of Systems  Objective: Vital Signs: There were no vitals taken for this visit.  Specialty Comments:  No specialty comments available.  PMFS History: Patient Active Problem List   Diagnosis Date Noted  . De Quervain's tenosynovitis, left 02/22/2016  . Acute appendicitis, uncomplicated 08/11/2010   History reviewed. No pertinent past medical history.  History reviewed. No pertinent family history.  Past Surgical History:  Procedure Laterality Date  . APPENDECTOMY     Social History   Occupational History  . Not on file  Tobacco Use  . Smoking status: Never Smoker  . Smokeless tobacco: Never Used  Substance and Sexual Activity  . Alcohol use: Yes  . Drug use: Not on file  . Sexual activity: Not on file

## 2017-02-13 NOTE — Addendum Note (Signed)
Addended by: Albertina ParrGARCIA, Emad Brechtel on: 02/13/2017 08:43 AM   Modules accepted: Orders

## 2017-03-03 ENCOUNTER — Telehealth (INDEPENDENT_AMBULATORY_CARE_PROVIDER_SITE_OTHER): Payer: Self-pay | Admitting: Orthopaedic Surgery

## 2017-03-03 ENCOUNTER — Other Ambulatory Visit (INDEPENDENT_AMBULATORY_CARE_PROVIDER_SITE_OTHER): Payer: Self-pay

## 2017-03-03 MED ORDER — PREDNISONE 50 MG PO TABS: ORAL_TABLET | ORAL | 0 refills | Status: AC

## 2017-03-03 NOTE — Telephone Encounter (Signed)
Rx sent into pharm. [facility] on [address]

## 2017-03-03 NOTE — Telephone Encounter (Signed)
Patient walked in this morning asking if he could get a RX for the pain in his hand. He said the prednisone worked really well for him. He just wants something to hold him over til his appointment next Wednesday with Dr. Alvester MorinNewton for the nerve conduction study. States he cannot get any sleep at night just from the pain and would just like a good nights sleep. CB# [phone]

## 2017-03-03 NOTE — Telephone Encounter (Signed)
Let's do another prednisone taper then.

## 2017-03-08 ENCOUNTER — Encounter (INDEPENDENT_AMBULATORY_CARE_PROVIDER_SITE_OTHER): Payer: Self-pay | Admitting: Physical Medicine and Rehabilitation

## 2017-03-08 ENCOUNTER — Ambulatory Visit (INDEPENDENT_AMBULATORY_CARE_PROVIDER_SITE_OTHER): Payer: Self-pay | Admitting: Physical Medicine and Rehabilitation

## 2017-03-08 DIAGNOSIS — R202 Paresthesia of skin: Secondary | ICD-10-CM

## 2017-03-08 NOTE — Progress Notes (Deleted)
Right arm pain- worse at night. Tightness and pain in first, second, and fourth ringers with some numbness. Says he can handle it during the day while at work, but the symptoms seem to worsen when he gets home and his "hands relax more." Right hand dominant.

## 2017-03-09 ENCOUNTER — Encounter (INDEPENDENT_AMBULATORY_CARE_PROVIDER_SITE_OTHER): Payer: Self-pay | Admitting: Physical Medicine and Rehabilitation

## 2017-03-09 NOTE — Procedures (Signed)
EMG & NCV Findings: Evaluation of the right median motor nerve showed prolonged distal onset latency (5.6 ms) and decreased conduction velocity (Elbow-Wrist, 47 m/s).  The right median (across palm) sensory nerve showed no response (Palm) and prolonged distal peak latency (5.0 ms).  All remaining nerves (as indicated in the following tables) were within normal limits.    All examined muscles (as indicated in the following table) showed no evidence of electrical instability.     Impression: The above electrodiagnostic study is ABNORMAL and reveals evidence of a moderate median nerve entrapment at the wrist (carpal tunnel syndrome) affecting sensory and motor components.   There is no significant electrodiagnostic evidence of any other focal nerve entrapment, brachial plexopathy or generalized peripheral neuropathy.   Recommendations: 1.  Follow-up with referring physician. 2.  Continue current management of symptoms. 3.  Continue use of resting splint at night-time and as needed during the day. 4.  Suggest surgical evaluation.   Nerve Conduction Studies Anti Sensory Summary Table   Stim Site NR Peak (ms) Norm Peak (ms) P-T Amp (V) Norm P-T Amp Site1 Site2 Delta-P (ms) Dist (cm) Vel (m/s) Norm Vel (m/s)  Right Median Acr Palm Anti Sensory (2nd Digit)  32.5C  Wrist    *5.0 <3.6 20.0 >10 Wrist Palm  0.0    Palm *NR  <2.0          Right Radial Anti Sensory (Base 1st Digit)  32.8C  Wrist    2.2 <3.1 25.2  Wrist Base 1st Digit 2.2 0.0    Right Ulnar Anti Sensory (5th Digit)  33.1C  Wrist    3.0 <3.7 28.5 >15.0 Wrist 5th Digit 3.0 14.0 47 >38   Motor Summary Table   Stim Site NR Onset (ms) Norm Onset (ms) O-P Amp (mV) Norm O-P Amp Site1 Site2 Delta-0 (ms) Dist (cm) Vel (m/s) Norm Vel (m/s)  Right Median Motor (Abd Poll Brev)  32.5C  Wrist    *5.6 <4.2 8.6 >5 Elbow Wrist 4.7 22.0 *47 >50  Elbow    10.3  9.3         Right Ulnar Motor (Abd Dig Min)  32.5C  Wrist    2.7 <4.2 8.7 >3 B  Elbow Wrist 3.6 23.0 64 >53  B Elbow    6.3  8.1  A Elbow B Elbow 1.2 10.0 83 >53  A Elbow    7.5  8.1          EMG   Side Muscle Nerve Root Ins Act Fibs Psw Amp Dur Poly Recrt Int Dennie Bible Comment  Right Abd Poll Brev Median C8-T1 Nml Nml Nml Nml Nml 0 Nml Nml   Right 1stDorInt Ulnar C8-T1 Nml Nml Nml Nml Nml 0 Nml Nml   Right PronatorTeres Median C6-7 Nml Nml Nml Nml Nml 0 Nml Nml     Nerve Conduction Studies Anti Sensory Left/Right Comparison   Stim Site L Lat (ms) R Lat (ms) L-R Lat (ms) L Amp (V) R Amp (V) L-R Amp (%) Site1 Site2 L Vel (m/s) R Vel (m/s) L-R Vel (m/s)  Median Acr Palm Anti Sensory (2nd Digit)  32.5C  Wrist  *5.0   20.0  Wrist Palm     Palm             Radial Anti Sensory (Base 1st Digit)  32.8C  Wrist  2.2   25.2  Wrist Base 1st Digit     Ulnar Anti Sensory (5th Digit)  33.1C  Wrist  3.0  28.5  Wrist 5th Digit  47    Motor Left/Right Comparison   Stim Site L Lat (ms) R Lat (ms) L-R Lat (ms) L Amp (mV) R Amp (mV) L-R Amp (%) Site1 Site2 L Vel (m/s) R Vel (m/s) L-R Vel (m/s)  Median Motor (Abd Poll Brev)  32.5C  Wrist  *5.6   8.6  Elbow Wrist  *47   Elbow  10.3   9.3        Ulnar Motor (Abd Dig Min)  32.5C  Wrist  2.7   8.7  B Elbow Wrist  64   B Elbow  6.3   8.1  A Elbow B Elbow  83   A Elbow  7.5   8.1           Waveforms:

## 2017-03-09 NOTE — Progress Notes (Signed)
Gary Osborne - 37 y.o. male MRN [id number]  Date of birth: 1980-03-13  Office Visit Note: Visit Date: 03/08/2017 PCP: Patient, No Pcp Per Referred by: No ref. provider found  Subjective: Chief Complaint  Patient presents with  . Right Arm - Pain  . Right Hand - Numbness, Pain   HPI: Gary Osborne is a 37 year old right-hand-dominant gentleman who comes in today at the request of Dr. Roda Shutters for electrodiagnostic study of the right upper limb.  He reports worsening right arm pain particularly at night with tightness first second fourth years with numbness.  He reports that during the day at work he gets some symptoms but he is okay but at night it really becomes prominent.  He has had no prior electrodiagnostic studies.  He denies any frank radicular pain.  He said no specific trauma.  Failed conservative care with medication management he was initially seen by Dr. Lajoyce Corners was diagnosed with de Quervain's tenosynovitis.  He went on to follow up with Dr. Roda Shutters.  He reports consistent worsening.    ROS Otherwise per HPI.  Assessment & Plan: Visit Diagnoses:  1. Paresthesia of skin     Plan: No additional findings.  Impression: The above electrodiagnostic study is ABNORMAL and reveals evidence of a moderate median nerve entrapment at the wrist (carpal tunnel syndrome) affecting sensory and motor components.   There is no significant electrodiagnostic evidence of any other focal nerve entrapment, brachial plexopathy or generalized peripheral neuropathy.   Recommendations: 1.  Follow-up with referring physician. 2.  Continue current management of symptoms. 3.  Continue use of resting splint at night-time and as needed during the day. 4.  Suggest surgical evaluation.  Meds & Orders: No orders of the defined types were placed in this encounter.   Orders Placed This Encounter  Procedures  . NCV with EMG (electromyography)    Follow-up: Return if symptoms worsen or fail  to improve.   Procedures: No procedures performed  EMG & NCV Findings: Evaluation of the right median motor nerve showed prolonged distal onset latency (5.6 ms) and decreased conduction velocity (Elbow-Wrist, 47 m/s).  The right median (across palm) sensory nerve showed no response (Palm) and prolonged distal peak latency (5.0 ms).  All remaining nerves (as indicated in the following tables) were within normal limits.    All examined muscles (as indicated in the following table) showed no evidence of electrical instability.     Impression: The above electrodiagnostic study is ABNORMAL and reveals evidence of a moderate median nerve entrapment at the wrist (carpal tunnel syndrome) affecting sensory and motor components.   There is no significant electrodiagnostic evidence of any other focal nerve entrapment, brachial plexopathy or generalized peripheral neuropathy.   Recommendations: 1.  Follow-up with referring physician. 2.  Continue current management of symptoms. 3.  Continue use of resting splint at night-time and as needed during the day. 4.  Suggest surgical evaluation.   Nerve Conduction Studies Anti Sensory Summary Table   Stim Site NR Peak (ms) Norm Peak (ms) P-T Amp (V) Norm P-T Amp Site1 Site2 Delta-P (ms) Dist (cm) Vel (m/s) Norm Vel (m/s)  Right Median Acr Palm Anti Sensory (2nd Digit)  32.5C  Wrist    *5.0 <3.6 20.0 >10 Wrist Palm  0.0    Palm *NR  <2.0          Right Radial Anti Sensory (Base 1st Digit)  32.8C  Wrist    2.2 <3.1 25.2  Wrist Base 1st Digit 2.2  0.0    Right Ulnar Anti Sensory (5th Digit)  33.1C  Wrist    3.0 <3.7 28.5 >15.0 Wrist 5th Digit 3.0 14.0 47 >38   Motor Summary Table   Stim Site NR Onset (ms) Norm Onset (ms) O-P Amp (mV) Norm O-P Amp Site1 Site2 Delta-0 (ms) Dist (cm) Vel (m/s) Norm Vel (m/s)  Right Median Motor (Abd Poll Brev)  32.5C  Wrist    *5.6 <4.2 8.6 >5 Elbow Wrist 4.7 22.0 *47 >50  Elbow    10.3  9.3         Right Ulnar  Motor (Abd Dig Min)  32.5C  Wrist    2.7 <4.2 8.7 >3 B Elbow Wrist 3.6 23.0 64 >53  B Elbow    6.3  8.1  A Elbow B Elbow 1.2 10.0 83 >53  A Elbow    7.5  8.1          EMG   Side Muscle Nerve Root Ins Act Fibs Psw Amp Dur Poly Recrt Int Dennie BiblePat Comment  Right Abd Poll Brev Median C8-T1 Nml Nml Nml Nml Nml 0 Nml Nml   Right 1stDorInt Ulnar C8-T1 Nml Nml Nml Nml Nml 0 Nml Nml   Right PronatorTeres Median C6-7 Nml Nml Nml Nml Nml 0 Nml Nml     Nerve Conduction Studies Anti Sensory Left/Right Comparison   Stim Site L Lat (ms) R Lat (ms) L-R Lat (ms) L Amp (V) R Amp (V) L-R Amp (%) Site1 Site2 L Vel (m/s) R Vel (m/s) L-R Vel (m/s)  Median Acr Palm Anti Sensory (2nd Digit)  32.5C  Wrist  *5.0   20.0  Wrist Palm     Palm             Radial Anti Sensory (Base 1st Digit)  32.8C  Wrist  2.2   25.2  Wrist Base 1st Digit     Ulnar Anti Sensory (5th Digit)  33.1C  Wrist  3.0   28.5  Wrist 5th Digit  47    Motor Left/Right Comparison   Stim Site L Lat (ms) R Lat (ms) L-R Lat (ms) L Amp (mV) R Amp (mV) L-R Amp (%) Site1 Site2 L Vel (m/s) R Vel (m/s) L-R Vel (m/s)  Median Motor (Abd Poll Brev)  32.5C  Wrist  *5.6   8.6  Elbow Wrist  *47   Elbow  10.3   9.3        Ulnar Motor (Abd Dig Min)  32.5C  Wrist  2.7   8.7  B Elbow Wrist  64   B Elbow  6.3   8.1  A Elbow B Elbow  83   A Elbow  7.5   8.1           Waveforms:            Clinical History: No specialty comments available.  He reports that  has never smoked. he has never used smokeless tobacco. No results for input(s): HGBA1C, LABURIC in the last 8760 hours.  Objective:  VS:  HT:    WT:   BMI:     BP:   HR: bpm  TEMP: ( )  RESP:  Physical Exam  Musculoskeletal:  Inspection reveals no atrophy of the bilateral APB or FDI or hand intrinsics. There is no swelling, color changes, allodynia or dystrophic changes. There is 5 out of 5 strength in the bilateral wrist extension, finger abduction and long finger flexion. There is  intact sensation to  light touch in all dermatomal and peripheral nerve distributions. There is a negative Froment's test bilaterally. There is a negative Tinel's test at the bilateral wrist and elbow. There is a negative Phalen's test bilaterally. There is a negative Hoffmann's test bilaterally.    Ortho Exam Imaging: No results found.  Past Medical/Family/Surgical/Social History: Medications & Allergies reviewed per EMR Patient Active Problem List   Diagnosis Date Noted  . De Quervain's tenosynovitis, left 02/22/2016  . Acute appendicitis, uncomplicated 08/11/2010   History reviewed. No pertinent past medical history. History reviewed. No pertinent family history. Past Surgical History:  Procedure Laterality Date  . APPENDECTOMY     Social History   Occupational History  . Not on file  Tobacco Use  . Smoking status: Never Smoker  . Smokeless tobacco: Never Used  Substance and Sexual Activity  . Alcohol use: Yes  . Drug use: Not on file  . Sexual activity: Not on file

## 2017-03-13 ENCOUNTER — Encounter (INDEPENDENT_AMBULATORY_CARE_PROVIDER_SITE_OTHER): Payer: Self-pay | Admitting: Orthopaedic Surgery

## 2017-03-13 ENCOUNTER — Ambulatory Visit (INDEPENDENT_AMBULATORY_CARE_PROVIDER_SITE_OTHER): Payer: Self-pay | Admitting: Orthopaedic Surgery

## 2017-03-13 DIAGNOSIS — G5601 Carpal tunnel syndrome, right upper limb: Secondary | ICD-10-CM

## 2017-03-13 NOTE — Progress Notes (Signed)
   Office Visit Note   Patient: Gary GavelJaime Osborne           Date of Birth: [birth year]           MRN: [id number] Visit Date: 03/13/2017              Requested by: No referring provider defined for this encounter. PCP: Patient, No Pcp Per   Assessment & Plan: Visit Diagnoses:  1. Carpal tunnel syndrome, right upper limb     Plan: Impression is 37 year old gentleman with carpal tunnel syndrome.  We discussed surgical versus nonsurgical options and the associated risks benefits alternatives.  He understands and wishes to proceed with surgical release.  We will get him scheduled in the near future.  Follow-Up Instructions: Return if symptoms worsen or fail to improve.   Orders:  No orders of the defined types were placed in this encounter.  No orders of the defined types were placed in this encounter.     Procedures: No procedures performed   Clinical Data: No additional findings.   Subjective: Chief Complaint  Patient presents with  . Follow-up    review EMG/NCV    Patient is a 37 year old gentleman who comes in today for review of his nerve conduction studies.    Review of Systems   Objective: Vital Signs: There were no vitals taken for this visit.  Physical Exam  Ortho Exam Exam is stable.  Impression is moderate right carpal tunnel syndrome.  We discussed treatment options both surgical versus nonsurgical and the associated risk benefits alternatives. Specialty Comments:  No specialty comments available.  Imaging: No results found.   PMFS History: Patient Active Problem List   Diagnosis Date Noted  . De Quervain's tenosynovitis, left 02/22/2016  . Acute appendicitis, uncomplicated 08/11/2010   History reviewed. No pertinent past medical history.  History reviewed. No pertinent family history.  Past Surgical History:  Procedure Laterality Date  . APPENDECTOMY     Social History   Occupational History  . Not on file  Tobacco Use  .  Smoking status: Never Smoker  . Smokeless tobacco: Never Used  Substance and Sexual Activity  . Alcohol use: Yes  . Drug use: Not on file  . Sexual activity: Not on file

## 2021-03-11 ENCOUNTER — Encounter: Payer: Self-pay | Admitting: Emergency Medicine

## 2021-03-11 ENCOUNTER — Ambulatory Visit
Admission: EM | Admit: 2021-03-11 | Discharge: 2021-03-11 | Disposition: A | Discharge status: Home / Self Care | Payer: Self-pay | Attending: Nurse Practitioner | Admitting: Nurse Practitioner

## 2021-03-11 ENCOUNTER — Other Ambulatory Visit: Payer: Self-pay

## 2021-03-11 DIAGNOSIS — J111 Influenza due to unidentified influenza virus with other respiratory manifestations: Secondary | ICD-10-CM

## 2021-03-11 DIAGNOSIS — R509 Fever, unspecified: Secondary | ICD-10-CM

## 2021-03-11 DIAGNOSIS — Z1152 Encounter for screening for COVID-19: Secondary | ICD-10-CM

## 2021-03-11 LAB — POCT INFLUENZA A/B
Influenza A, POC: NEGATIVE
Influenza B, POC: NEGATIVE

## 2021-03-11 MED ORDER — IBUPROFEN 800 MG PO TABS
800.0000 mg | ORAL_TABLET | Freq: Once | ORAL | Status: AC
  Administered 2021-03-11: 800 mg via ORAL

## 2021-03-11 NOTE — ED Provider Notes (Signed)
[facility]    CSN: [id number] Arrival date & time: 03/11/21  0806      History   Chief Complaint Chief Complaint  Patient presents with   Generalized Body Aches    HPI Gary Osborne is a 41 y.o. male.   Subjective:   Gary Osborne is a 41 y.o. male who presents for evaluation of influenza like symptoms. Symptoms include suspected fevers but not measured at home, chills, dry cough, headache, myalgia and runny nose.  Symptoms have been present for the past 2 days. He has tried to alleviate the symptoms with rest and a medication from [place] with minimal relief. High risk factors for influenza complications: none.  Denies any sick contacts or known COVID exposure.  He has never tested positive for COVID but thinks he had it a couple years ago.  He has been vaccinated against COVID with no boosters.  He is also had the flu vaccine this year.  The following portions of the patient's history were reviewed and updated as appropriate: allergies, current medications, past family history, past medical history, past social history, past surgical history, and problem list.      History reviewed. No pertinent past medical history.  Patient Active Problem List   Diagnosis Date Noted   De Quervain's tenosynovitis, left 02/22/2016   Acute appendicitis, uncomplicated 08/11/2010    Past Surgical History:  Procedure Laterality Date   APPENDECTOMY         Home Medications    Prior to Admission medications   Medication Sig Start Date End Date Taking? Authorizing Provider  naproxen (NAPROSYN) 500 MG tablet Take 1 tablet (500 mg total) by mouth 2 (two) times daily with a meal. 08/11/10   Karie Soda, MD  predniSONE (DELTASONE) 50 MG tablet Take one tablet by mouth for 5 days. 03/03/17   Tarry Kos, MD    Family History No family history on file.  Social History Social History   Tobacco Use   Smoking status: Never   Smokeless tobacco:  Never  Substance Use Topics   Alcohol use: Yes     Allergies   Patient has no known allergies.   Review of Systems Review of Systems  Constitutional:  Positive for chills, fatigue and fever.  HENT:  Positive for rhinorrhea. Negative for congestion, sneezing and sore throat.   Respiratory:  Positive for cough. Negative for shortness of breath.   Gastrointestinal:  Negative for diarrhea, nausea and vomiting.  Musculoskeletal:  Positive for myalgias.  Neurological:  Positive for headaches.  All other systems reviewed and are negative.   Physical Exam Triage Vital Signs ED Triage Vitals [03/11/21 0831]  Enc Vitals Group     BP (!) 138/98     Pulse Rate (!) 123     Resp 20     Temp (!) 102.8 F (39.3 C)     Temp Source Oral     SpO2 94 %     Weight      Height      Head Circumference      Peak Flow      Pain Score 4     Pain Loc      Pain Edu?      Excl. in GC?    No data found.  Updated Vital Signs BP (!) 138/98 (BP Location: Left Arm)    Pulse (!) 123    Temp (!) 102.8 F (39.3 C) (Oral)    Resp 20    SpO2  94%   Visual Acuity Right Eye Distance:   Left Eye Distance:   Bilateral Distance:    Right Eye Near:   Left Eye Near:    Bilateral Near:     Physical Exam Vitals reviewed.  Constitutional:      General: He is not in acute distress.    Appearance: Normal appearance. He is not ill-appearing, toxic-appearing or diaphoretic.  HENT:     Head: Normocephalic.     Nose: Nose normal.     Mouth/Throat:     Mouth: Mucous membranes are moist.  Eyes:     Extraocular Movements: Extraocular movements intact.     Conjunctiva/sclera: Conjunctivae normal.     Pupils: Pupils are equal, round, and reactive to light.  Cardiovascular:     Rate and Rhythm: Regular rhythm. Tachycardia present.  Pulmonary:     Effort: Pulmonary effort is normal.  Abdominal:     Palpations: Abdomen is soft.  Musculoskeletal:        General: Normal range of motion.     Cervical  back: Normal range of motion and neck supple.  Lymphadenopathy:     Cervical: No cervical adenopathy.  Skin:    General: Skin is warm and dry.  Neurological:     General: No focal deficit present.     Mental Status: He is alert and oriented to person, place, and time.  Psychiatric:        Mood and Affect: Mood normal.        Behavior: Behavior normal.     UC Treatments / Results  Labs (all labs ordered are listed, but only abnormal results are displayed) Labs Reviewed  NOVEL CORONAVIRUS, NAA  POCT INFLUENZA A/B    EKG   Radiology No results found.  Procedures Procedures (including critical care time)  Medications Ordered in UC Medications  ibuprofen (ADVIL) tablet 800 mg (800 mg Oral Given 03/11/21 [id number])    Initial Impression / Assessment and Plan / UC Course  I have reviewed the triage vital signs and the nursing notes.  Pertinent labs & imaging results that were available during my care of the patient were reviewed by me and considered in my medical decision making (see chart for details).    41 year old male presenting with a 2-day history of fevers, chills, body aches, headache, cough and runny nose.  On exam he is febrile at 102.8.  Physical exam unremarkable.  Flu testing negative.  COVID test pending. Isolate until COVID results have been received.  For now, supportive care with appropriate antipyretics and fluids advised.  Further recommendations pending results of the COVID test.  Today's evaluation has revealed no signs of a dangerous process. Discussed diagnosis with patient and/or guardian. Patient and/or guardian aware of their diagnosis, possible red flag symptoms to watch out for and need for close follow up. Patient and/or guardian understands verbal and written discharge instructions. Patient and/or guardian comfortable with plan and disposition.  Patient and/or guardian has a clear mental status at this time, good insight into illness (after discussion and  teaching) and has clear judgment to make decisions regarding their care  This care was provided during an unprecedented National Emergency due to the Novel Coronavirus (COVID-19) pandemic. COVID-19 infections and transmission risks place heavy strains on healthcare resources.  As this pandemic evolves, our facility, providers, and staff strive to respond fluidly, to remain operational, and to provide care relative to available resources and information. Outcomes are unpredictable and treatments are without well-defined guidelines. Further,  the impact of COVID-19 on all aspects of urgent care, including the impact to patients seeking care for reasons other than COVID-19, is unavoidable during this national emergency. At this time of the global pandemic, management of patients has significantly changed, even for non-COVID positive patients given high local and regional COVID volumes at this time requiring high healthcare system and resource utilization. The standard of care for management of both COVID suspected and non-COVID suspected patients continues to change rapidly at the local, regional, national, and global levels. This patient was worked up and treated to the best available but ever changing evidence and resources available at this current time.   Documentation was completed with the aid of voice recognition software. Transcription may contain typographical errors. Final Clinical Impressions(s) / UC Diagnoses   Final diagnoses:  Fever, unspecified  Encounter for screening for COVID-19  Influenza-like illness     Discharge Instructions      Your flu test was negative. Take tylenol or ibuprofen as needed for fevers/headache/body aches. Drink plenty of fluids. Stay in home isolation until you receive results of your COVID test. You will only be notified for positive results. You may go online to MyChart and review your results. Go to the ED immediately if you get worse or have any other  symptoms.        ED Prescriptions   None    PDMP not reviewed this encounter.   Cathlean Sauer [place], [place] 03/11/21 [phone]

## 2021-03-11 NOTE — Discharge Instructions (Signed)
Your flu test was negative. Take tylenol or ibuprofen as needed for fevers/headache/body aches. Drink plenty of fluids. Stay in home isolation until you receive results of your COVID test. You will only be notified for positive results. You may go online to MyChart and review your results. Go to the ED immediately if you get worse or have any other symptoms.

## 2021-03-11 NOTE — ED Triage Notes (Signed)
Patient c/o headache, body aches, fever x 2 days.  Patient has been taken OTC pills for muscle pain from [place].

## 2021-03-12 LAB — SARS-COV-2, NAA 2 DAY TAT

## 2021-03-12 LAB — NOVEL CORONAVIRUS, NAA: SARS-CoV-2, NAA: DETECTED — AB

## 2021-06-18 ENCOUNTER — Ambulatory Visit
Admission: EM | Admit: 2021-06-18 | Discharge: 2021-06-18 | Disposition: A | Discharge status: Home / Self Care | Payer: Self-pay | Attending: Internal Medicine | Admitting: Internal Medicine

## 2021-06-18 DIAGNOSIS — N481 Balanitis: Secondary | ICD-10-CM

## 2021-06-18 MED ORDER — NYSTATIN 100000 UNIT/GM EX CREA: TOPICAL_CREAM | CUTANEOUS | 0 refills | Status: DC

## 2021-06-18 NOTE — ED Provider Notes (Signed)
?[facility] ? ? ? ?CSN: [id number] ?Arrival date & time: 06/18/21  1444 ? ? ?  ? ?History   ?Chief Complaint ?Chief Complaint  ?Patient presents with  ? Penile Discharge  ? ? ?HPI ?Gary Osborne is a 41 y.o. male.  ? ?Patient presents with irritation and redness to the distal end of penis that has been present for approximately 7 days.  Patient has used over-the-counter antibiotic cream with no improvement of symptoms.  Denies penile discharge, testicular pain, abdominal pain, fever, back pain.  Denies any known exposure or concern for STD.  Patient reports that this has occurred in the past few years prior and he was prescribed a cream with resolution of symptoms. ? ? ?Penile Discharge ? ? ?History reviewed. No pertinent past medical history. ? ?Patient Active Problem List  ? Diagnosis Date Noted  ? De Quervain's tenosynovitis, left 02/22/2016  ? Acute appendicitis, uncomplicated 08/11/2010  ? ? ?Past Surgical History:  ?Procedure Laterality Date  ? APPENDECTOMY    ? ? ? ? ? ?Home Medications   ? ?Prior to Admission medications   ?Medication Sig Start Date End Date Taking? Authorizing Provider  ?nystatin cream (MYCOSTATIN) Apply to affected area 2 times daily 06/18/21  Yes Norton, Ackerman E, FNP  ?naproxen (NAPROSYN) 500 MG tablet Take 1 tablet (500 mg total) by mouth 2 (two) times daily with a meal. 08/11/10   Karie Soda, MD  ?predniSONE (DELTASONE) 50 MG tablet Take one tablet by mouth for 5 days. 03/03/17   Tarry Kos, MD  ? ? ?Family History ?History reviewed. No pertinent family history. ? ?Social History ?Social History  ? ?Tobacco Use  ? Smoking status: Never  ? Smokeless tobacco: Never  ?Substance Use Topics  ? Alcohol use: Yes  ? ? ? ?Allergies   ?Patient has no known allergies. ? ? ?Review of Systems ?Review of Systems ?Per HPI ? ?Physical Exam ?Triage Vital Signs ?ED Triage Vitals  ?Enc Vitals Group  ?   BP 06/18/21 1521 (!) 143/98  ?   Pulse Rate 06/18/21 1521 (!) 111  ?   Resp  06/18/21 1521 18  ?   Temp 06/18/21 1521 98.6 ?F (37 ?C)  ?   Temp src --   ?   SpO2 06/18/21 1521 98 %  ?   Weight --   ?   Height --   ?   Head Circumference --   ?   Peak Flow --   ?   Pain Score 06/18/21 1520 2  ?   Pain Loc --   ?   Pain Edu? --   ?   Excl. in GC? --   ? ?No data found. ? ?Updated Vital Signs ?BP (!) 143/98 (BP Location: Right Arm)   Pulse (!) 111   Temp 98.6 ?F (37 ?C)   Resp 18   SpO2 98%  ? ?Visual Acuity ?Right Eye Distance:   ?Left Eye Distance:   ?Bilateral Distance:   ? ?Right Eye Near:   ?Left Eye Near:    ?Bilateral Near:    ? ?Physical Exam ?Exam conducted with a chaperone present.  ?Constitutional:   ?   General: He is not in acute distress. ?   Appearance: Normal appearance. He is not toxic-appearing or diaphoretic.  ?HENT:  ?   Head: Normocephalic and atraumatic.  ?Eyes:  ?   Extraocular Movements: Extraocular movements intact.  ?   Conjunctiva/sclera: Conjunctivae normal.  ?Pulmonary:  ?  Effort: Pulmonary effort is normal.  ?Genitourinary: ?   Testes: Normal. Cremasteric reflex is present.  ?   Comments: Erythema located to distal glans penis. ?Neurological:  ?   General: No focal deficit present.  ?   Mental Status: He is alert and oriented to person, place, and time. Mental status is at baseline.  ?Psychiatric:     ?   Mood and Affect: Mood normal.     ?   Behavior: Behavior normal.     ?   Thought Content: Thought content normal.     ?   Judgment: Judgment normal.  ? ? ? ?UC Treatments / Results  ?Labs ?(all labs ordered are listed, but only abnormal results are displayed) ?Labs Reviewed - No data to display ? ?EKG ? ? ?Radiology ?No results found. ? ?Procedures ?Procedures (including critical care time) ? ?Medications Ordered in UC ?Medications - No data to display ? ?Initial Impression / Assessment and Plan / UC Course  ?I have reviewed the triage vital signs and the nursing notes. ? ?Pertinent labs & imaging results that were available during my care of the patient  were reviewed by me and considered in my medical decision making (see chart for details). ? ?  ? ?Suspect balanitis.  Will prescribe nystatin as fungal infection is most likely cause.  Patient advised to follow-up if symptoms persist or worsen.  Discussed return precautions.  Patient verbalized understanding and was agreeable with plan. ?Final Clinical Impressions(s) / UC Diagnoses  ? ?Final diagnoses:  ?Balanitis  ? ? ? ?Discharge Instructions   ? ?  ?It appears that you have balanitis which is typically a fungal infection of the skin of the penis.  You have been prescribed a cream to help treat this.  Please follow-up if symptoms persist or worsen. ? ? ? ?ED Prescriptions   ? ? Medication Sig Dispense Auth. Provider  ? nystatin cream (MYCOSTATIN) Apply to affected area 2 times daily 30 g Gustavus Bryant, [place]  ? ?  ? ?PDMP not reviewed this encounter. ?  ?Gustavus Bryant, [place] ?06/18/21 1557 ? ?

## 2021-06-18 NOTE — Discharge Instructions (Signed)
It appears that you have balanitis which is typically a fungal infection of the skin of the penis.  You have been prescribed a cream to help treat this.  Please follow-up if symptoms persist or worsen. ?

## 2021-06-18 NOTE — ED Triage Notes (Signed)
Patient presents to Urgent Care with complaints of discharge from the skin around the head of his penis that is itchy and burns. For 7 days Patient reports he attempted antibiotic cream with no help.  ? ?

## 2022-09-09 ENCOUNTER — Ambulatory Visit
Admission: EM | Admit: 2022-09-09 | Discharge: 2022-09-09 | Disposition: A | Discharge status: Home / Self Care | Payer: Self-pay | Attending: Internal Medicine | Admitting: Internal Medicine

## 2022-09-09 DIAGNOSIS — N481 Balanitis: Secondary | ICD-10-CM

## 2022-09-09 MED ORDER — NYSTATIN 100000 UNIT/GM EX CREA: TOPICAL_CREAM | CUTANEOUS | 0 refills | Status: AC

## 2022-09-09 NOTE — Discharge Instructions (Signed)
I have refilled your cream.  Follow-up with family medicine doctor if symptoms persist or worsen.

## 2022-09-09 NOTE — ED Triage Notes (Signed)
Pt states he needs a refill on Nystatin cream that he used for a rash on his penis.  States it did help him but the rash is back and he is out.

## 2022-09-09 NOTE — ED Provider Notes (Addendum)
[facility]    CSN: [id number] Arrival date & time: 09/09/22  0816      History   Chief Complaint Chief Complaint  Patient presents with   Medication Refill    HPI Gary Osborne is a 42 y.o. male.   Patient presents today for refill for nystatin cream.  Patient reports that he was previously diagnosed with balanitis back in May.  Used nystatin cream with improvement in symptoms but reports that it returned about 1 month ago.  Reports red rash to the distal end of his penis.   Medication Refill   History reviewed. No pertinent past medical history.  Patient Active Problem List   Diagnosis Date Noted   De Quervain's tenosynovitis, left 02/22/2016   Acute appendicitis, uncomplicated 08/11/2010    Past Surgical History:  Procedure Laterality Date   APPENDECTOMY         Home Medications    Prior to Admission medications   Medication Sig Start Date End Date Taking? Authorizing Provider  naproxen (NAPROSYN) 500 MG tablet Take 1 tablet (500 mg total) by mouth 2 (two) times daily with a meal. 08/11/10   Karie Soda, MD  nystatin cream (MYCOSTATIN) Apply to affected area 2 times daily 09/09/22   Gustavus Bryant, FNP  predniSONE (DELTASONE) 50 MG tablet Take one tablet by mouth for 5 days. 03/03/17   Tarry Kos, MD    Family History History reviewed. No pertinent family history.  Social History Social History   Tobacco Use   Smoking status: Never   Smokeless tobacco: Never  Substance Use Topics   Alcohol use: Yes     Allergies   Patient has no known allergies.   Review of Systems Review of Systems Per HPI  Physical Exam Triage Vital Signs ED Triage Vitals  Encounter Vitals Group     BP 09/09/22 0843 (!) 154/89     Systolic BP Percentile --      Diastolic BP Percentile --      Pulse Rate 09/09/22 0843 92     Resp 09/09/22 0843 16     Temp 09/09/22 0843 98.2 F (36.8 C)     Temp Source 09/09/22 0843 Oral     SpO2  09/09/22 0843 94 %     Weight 09/09/22 0844 220 lb (99.8 kg)     Height 09/09/22 0844 5\' 6"  (1.676 m)     Head Circumference --      Peak Flow --      Pain Score 09/09/22 0844 0     Pain Loc --      Pain Education --      Exclude from Growth Chart --    No data found.  Updated Vital Signs BP (!) 154/89 (BP Location: Left Arm)   Pulse 92   Temp 98.2 F (36.8 C) (Oral)   Resp 16   Ht 5\' 6"  (1.676 m)   Wt 220 lb (99.8 kg)   SpO2 94%   BMI 35.51 kg/m   Visual Acuity Right Eye Distance:   Left Eye Distance:   Bilateral Distance:    Right Eye Near:   Left Eye Near:    Bilateral Near:     Physical Exam Exam conducted with a chaperone present.  Constitutional:      General: He is not in acute distress.    Appearance: Normal appearance. He is not toxic-appearing or diaphoretic.  HENT:     Head: Normocephalic and atraumatic.  Eyes:  Extraocular Movements: Extraocular movements intact.     Conjunctiva/sclera: Conjunctivae normal.  Pulmonary:     Effort: Pulmonary effort is normal.  Genitourinary:    Penis: Uncircumcised.      Testes: Normal. Cremasteric reflex is present.     Comments: When foreskin is retracted, there is an area of erythema present directly below the glans penis. No significant swelling noted.  Neurological:     General: No focal deficit present.     Mental Status: He is alert and oriented to person, place, and time. Mental status is at baseline.  Psychiatric:        Mood and Affect: Mood normal.        Behavior: Behavior normal.        Thought Content: Thought content normal.        Judgment: Judgment normal.      UC Treatments / Results  Labs (all labs ordered are listed, but only abnormal results are displayed) Labs Reviewed - No data to display  EKG   Radiology No results found.  Procedures Procedures (including critical care time)  Medications Ordered in UC Medications - No data to display  Initial Impression / Assessment  and Plan / UC Course  I have reviewed the triage vital signs and the nursing notes.  Pertinent labs & imaging results that were available during my care of the patient were reviewed by me and considered in my medical decision making (see chart for details).     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  Will treat with nystatin topically.  Advised patient to follow-up with family medicine doctor or urgent care if symptoms persist or worsen.  Patient verbalized understanding and was agreeable with plan. Final Clinical Impressions(s) / UC Diagnoses   Final diagnoses:  Balanitis     Discharge Instructions      I have refilled your cream.  Follow-up with family medicine doctor if symptoms persist or worsen.    ED Prescriptions     Medication Sig Dispense Auth. Provider   nystatin cream (MYCOSTATIN) Apply to affected area 2 times daily 30 g Gustavus Bryant, [place]      PDMP not reviewed this encounter.   Gustavus Bryant, [place] 09/09/22 0932    Gustavus Bryant, [place] 09/09/22 [phone]
